# Patient Record
Sex: Male | Born: 1995 | Race: White | Hispanic: No | Marital: Single | Smoking: Never smoker
Health system: Southern US, Community
[De-identification: ages and names within clinical notes are randomized; demographics above are authoritative.]

## PROBLEM LIST (undated history)

## (undated) DIAGNOSIS — Q909 Down syndrome, unspecified: Secondary | ICD-10-CM

## (undated) DIAGNOSIS — E039 Hypothyroidism, unspecified: Secondary | ICD-10-CM

## (undated) DIAGNOSIS — F419 Anxiety disorder, unspecified: Secondary | ICD-10-CM

## (undated) DIAGNOSIS — K219 Gastro-esophageal reflux disease without esophagitis: Secondary | ICD-10-CM

## (undated) DIAGNOSIS — F840 Autistic disorder: Secondary | ICD-10-CM

## (undated) DIAGNOSIS — R569 Unspecified convulsions: Secondary | ICD-10-CM

## (undated) DIAGNOSIS — J302 Other seasonal allergic rhinitis: Secondary | ICD-10-CM

## (undated) HISTORY — DX: Unspecified convulsions: R56.9

## (undated) HISTORY — DX: Other seasonal allergic rhinitis: J30.2

## (undated) HISTORY — DX: Hypothyroidism, unspecified: E03.9

## (undated) HISTORY — DX: Anxiety disorder, unspecified: F41.9

## (undated) HISTORY — DX: Gastro-esophageal reflux disease without esophagitis: K21.9

---
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
## 2014-10-20 ENCOUNTER — Encounter (HOSPITAL_BASED_OUTPATIENT_CLINIC_OR_DEPARTMENT_OTHER): Payer: Self-pay | Admitting: *Deleted

## 2014-10-20 ENCOUNTER — Emergency Department (HOSPITAL_BASED_OUTPATIENT_CLINIC_OR_DEPARTMENT_OTHER)
Admission: EM | Admit: 2014-10-20 | Discharge: 2014-10-20 | Disposition: A | Discharge status: Home / Self Care | Payer: Medicaid Other | Attending: Emergency Medicine | Admitting: Emergency Medicine

## 2014-10-20 DIAGNOSIS — Z79899 Other long term (current) drug therapy: Secondary | ICD-10-CM | POA: Diagnosis not present

## 2014-10-20 DIAGNOSIS — Q909 Down syndrome, unspecified: Secondary | ICD-10-CM | POA: Diagnosis not present

## 2014-10-20 DIAGNOSIS — F840 Autistic disorder: Secondary | ICD-10-CM | POA: Diagnosis not present

## 2014-10-20 DIAGNOSIS — R195 Other fecal abnormalities: Secondary | ICD-10-CM | POA: Diagnosis present

## 2014-10-20 DIAGNOSIS — K625 Hemorrhage of anus and rectum: Secondary | ICD-10-CM | POA: Insufficient documentation

## 2014-10-20 HISTORY — DX: Down syndrome, unspecified: Q90.9

## 2014-10-20 HISTORY — DX: Autistic disorder: F84.0

## 2014-10-20 LAB — COMPREHENSIVE METABOLIC PANEL
ALT: 32 U/L (ref 0–53)
AST: 20 U/L (ref 0–37)
Albumin: 3.8 g/dL (ref 3.5–5.2)
Alkaline Phosphatase: 71 U/L (ref 39–117)
Anion gap: 8 (ref 5–15)
BUN: 10 mg/dL (ref 6–23)
CO2: 29 mEq/L (ref 19–32)
Calcium: 9.5 mg/dL (ref 8.4–10.5)
Chloride: 103 mEq/L (ref 96–112)
Creatinine, Ser: 0.9 mg/dL (ref 0.50–1.35)
GFR calc Af Amer: >90 mL/min (ref >90)
GFR calc non Af Amer: >90 mL/min (ref >90)
Glucose, Bld: 91 mg/dL (ref 70–99)
Potassium: 4.4 mEq/L (ref 3.7–5.3)
Sodium: 140 mEq/L (ref 137–147)
Total Bilirubin: 0.3 mg/dL (ref 0.3–1.2)
Total Protein: 7.3 g/dL (ref 6.0–8.3)

## 2014-10-20 LAB — CBC WITH DIFFERENTIAL/PLATELET
Basophils Absolute: 0.1 10*3/uL (ref 0.0–0.1)
Basophils Relative: 1 % (ref 0–1)
Eosinophils Absolute: 0.1 10*3/uL (ref 0.0–0.7)
Eosinophils Relative: 2 % (ref 0–5)
HCT: 45.6 % (ref 39.0–52.0)
Hemoglobin: 16 g/dL (ref 13.0–17.0)
Lymphocytes Relative: 33 % (ref 12–46)
Lymphs Abs: 1.5 10*3/uL (ref 0.7–4.0)
MCH: 33.2 pg (ref 26.0–34.0)
MCHC: 35.1 g/dL (ref 30.0–36.0)
MCV: 94.6 fL (ref 78.0–100.0)
Monocytes Absolute: 0.5 10*3/uL (ref 0.1–1.0)
Monocytes Relative: 12 % (ref 3–12)
Neutro Abs: 2.3 10*3/uL (ref 1.7–7.7)
Neutrophils Relative %: 52 % (ref 43–77)
Platelets: 278 10*3/uL (ref 150–400)
RBC: 4.82 MIL/uL (ref 4.22–5.81)
RDW: 13.5 % (ref 11.5–15.5)
WBC: 4.4 10*3/uL (ref 4.0–10.5)

## 2014-10-20 NOTE — ED Provider Notes (Signed)
CSN: [id number]     Arrival date & time 10/20/14  [id number] History   First MD Initiated Contact with Patient 10/20/14 [phone]     Chief Complaint  Patient presents with  . Blood In Stools     (Consider location/radiation/quality/duration/timing/severity/associated sxs/prior Treatment) HPI Comments: Patient is a 18 year old male with history of Down syndrome and autism. He was brought here for evaluation of bloody stools that have been occurring for the past 2 days. He returned from camp one week ago but has not been having any problems until 2 days ago. There is been no constipation and no diarrhea. There is been no fever. Patient has not reported any pain, however he has a high pain tolerance and an inability to express when he hurts.  The history is provided by the patient.    Past Medical History  Diagnosis Date  . Autism   . Down's syndrome    Past Surgical History  Procedure Laterality Date  . Eye surgery    . Middle ear surgery     No family history on file. History  Substance Use Topics  . Smoking status: Never Smoker   . Smokeless tobacco: Never Used  . Alcohol Use: No    Review of Systems  All other systems reviewed and are negative.     Allergies  Sulfa antibiotics and Milk-related compounds  Home Medications   Prior to Admission medications   Medication Sig Start Date End Date Taking? Authorizing Provider  sertraline (ZOLOFT) 100 MG tablet Take 150 mg by mouth daily.   Yes Historical Provider, MD   BP 118/71 mmHg  Pulse 70  Temp(Src) 97.8 F (36.6 C) (Oral)  Resp 20  Wt 120 lb (54.432 kg)  SpO2 98% Physical Exam  Constitutional: He is oriented to person, place, and time. He appears well-developed and well-nourished. No distress.  HENT:  Head: Normocephalic and atraumatic.  Neck: Normal range of motion. Neck supple.  Cardiovascular: Normal rate, regular rhythm and normal heart sounds.   No murmur heard. Pulmonary/Chest: Effort normal and breath sounds  normal. No respiratory distress. He has no wheezes.  Abdominal: Soft. Bowel sounds are normal. He exhibits no distension. There is no tenderness.  Musculoskeletal: Normal range of motion. He exhibits no edema.  Neurological: He is alert and oriented to person, place, and time.  Skin: Skin is warm and dry. He is not diaphoretic.  Nursing note and vitals reviewed.   ED Course  Procedures (including critical care time) Labs Review Labs Reviewed - No data to display  Imaging Review No results found.   EKG Interpretation None      MDM   Final diagnoses:  None    Patient brought for evaluation of bloody bowel movements. Dad brought a picture which reveals bright red blood in the water surrounding his stool. He appears nontoxic, vitals are stable, and his laboratory studies are unremarkable. My ability to perform a rectal examination was very limited due to the patient's history of Down syndrome and autism.  I feel as though sedation for a simple rectal exam in this situation is not in the patient's best interest. I did discuss the case with Dr. Rhea BeltonPyrtle from gastroenterology who agrees with this assessment. We have decided that should the patient require sedation, it would be best to perform a more complete exam under a more controlled environment. He will make arrangements for the patient to be followed up in the office in the upcoming days.  I suspect the patient's  bleeding is related to a fissure. In the meantime I will recommend stool softeners and follow-up with GI. If he worsens, develops high fevers, severe pain, or other problems he is to return to the ER.    Michael Lyonsouglas Zitlaly Malson, MD 10/20/14 [phone]

## 2014-10-20 NOTE — ED Notes (Signed)
MD at bedside. 

## 2014-10-20 NOTE — Discharge Instructions (Signed)
Follow-up with Dr. Rhea BeltonPyrtle in the gastroenterology office. His contact information has been provided on this discharge summary. The office should call you in the next 2 days to arrange this appointment.   Bloody Stools Bloody stools often mean that there is a problem in the digestive tract. Your caregiver may use the term "melena" to describe black, tarry, and bad smelling stools or "hematochezia" to describe red or maroon-colored stools. Blood seen in the stool can be caused by bleeding anywhere along the intestinal tract.  A black stool usually means that blood is coming from the upper part of the gastrointestinal tract (esophagus, stomach, or small bowel). Passing maroon-colored stools or bright red blood usually means that blood is coming from lower down in the large bowel or the rectum. However, sometimes massive bleeding in the stomach or small intestine can cause bright red bloody stools.  Consuming black licorice, lead, iron pills, medicines containing bismuth subsalicylate, or blueberries can also cause black stools. Your caregiver can test black stools to see if blood is present. It is important that the cause of the bleeding be found. Treatment can then be started, and the problem can be corrected. Rectal bleeding may not be serious, but you should not assume everything is okay until you know the cause.It is very important to follow up with your caregiver or a specialist in gastrointestinal problems. CAUSES  Blood in the stools can come from various underlying causes.Often, the cause is not found during your first visit. Testing is often needed to discover the cause of bleeding in the gastrointestinal tract. Causes range from simple to serious or even life-threatening.Possible causes include:  Hemorrhoids.These are veins that are full of blood (engorged) in the rectum. They cause pain, inflammation, and may bleed.  Anal fissures.These are areas of painful tearing which may bleed. They are  often caused by passing hard stool.  Diverticulosis.These are pouches that form on the colon over time, with age, and may bleed significantly.  Diverticulitis.This is inflammation in areas with diverticulosis. It can cause pain, fever, and bloody stools, although bleeding is rare.  Proctitis and colitis. These are inflamed areas of the rectum or colon. They may cause pain, fever, and bloody stools.  Polyps and cancer. Colon cancer is a leading cause of preventable cancer death.It often starts out as precancerous polyps that can be removed during a colonoscopy, preventing progression into cancer. Sometimes, polyps and cancer may cause rectal bleeding.  Gastritis and ulcers.Bleeding from the upper gastrointestinal tract (near the stomach) may travel through the intestines and produce black, sometimes tarry, often bad smelling stools. In certain cases, if the bleeding is fast enough, the stools may not be black, but red and the condition may be life-threatening. SYMPTOMS  You may have stools that are bright red and bloody, that are normal color with blood on them, or that are dark black and tarry. In some cases, you may only have blood in the toilet bowl. Any of these cases need medical care. You may also have:  Pain at the anus or anywhere in the rectum.  Lightheadedness or feeling faint.  Extreme weakness.  Nausea or vomiting.  Fever. DIAGNOSIS Your caregiver may use the following methods to find the cause of your bleeding:  Taking a medical history. Age is important. Older people tend to develop polyps and cancer more often. If there is anal pain and a hard, large stool associated with bleeding, a tear of the anus may be the cause. If blood drips into  the toilet after a bowel movement, bleeding hemorrhoids may be the problem. The color and frequency of the bleeding are additional considerations. In most cases, the medical history provides clues, but seldom the final answer.  A visual  and finger (digital) exam. Your caregiver will inspect the anal area, looking for tears and hemorrhoids. A finger exam can provide information when there is tenderness or a growth inside. In men, the prostate is also examined.  Endoscopy. Several types of small, long scopes (endoscopes) are used to view the colon.  In the office, your caregiver may use a rigid, or more commonly, a flexible viewing sigmoidoscope. This exam is called flexible sigmoidoscopy. It is performed in 5 to 10 minutes.  A more thorough exam is accomplished with a colonoscope. It allows your caregiver to view the entire 5 to 6 foot long colon. Medicine to help you relax (sedative) is usually given for this exam. Frequently, a bleeding lesion may be present beyond the reach of the sigmoidoscope. So, a colonoscopy may be the best exam to start with. Both exams are usually done on an outpatient basis. This means the patient does not stay overnight in the hospital or surgery center.  An upper endoscopy may be needed to examine your stomach. Sedation is used and a flexible endoscope is put in your mouth, down to your stomach.  A barium enema X-ray. This is an X-ray exam. It uses liquid barium inserted by enema into the rectum. This test alone may not identify an actual bleeding point. X-rays highlight abnormal shadows, such as those made by lumps (tumors), diverticuli, or colitis. TREATMENT  Treatment depends on the cause of your bleeding.   For bleeding from the stomach or colon, the caregiver doing your endoscopy or colonoscopy may be able to stop the bleeding as part of the procedure.  Inflammation or infection of the colon can be treated with medicines.  Many rectal problems can be treated with creams, suppositories, or warm baths.  Surgery is sometimes needed.  Blood transfusions are sometimes needed if you have lost a lot of blood.  For any bleeding problem, let your caregiver know if you take aspirin or other blood  thinners regularly. HOME CARE INSTRUCTIONS   Take any medicines exactly as prescribed.  Keep your stools soft by eating a diet high in fiber. Prunes (1 to 3 a day) work well for many people.  Drink enough water and fluids to keep your urine clear or pale yellow.  Take sitz baths if advised. A sitz bath is when you sit in a bathtub with warm water for 10 to 15 minutes to soak, soothe, and cleanse the rectal area.  If enemas or suppositories are advised, be sure you know how to use them. Tell your caregiver if you have problems with this.  Monitor your bowel movements to look for signs of improvement or worsening. SEEK MEDICAL CARE IF:   You do not improve in the time expected.  Your condition worsens after initial improvement.  You develop any new symptoms. SEEK IMMEDIATE MEDICAL CARE IF:   You develop severe or prolonged rectal bleeding.  You vomit blood.  You feel weak or faint.  You have a fever. MAKE SURE YOU:  Understand these instructions.  Will watch your condition.  Will get help right away if you are not doing well or get worse. Document Released: 11/19/2002 Document Revised: 02/21/2012 Document Reviewed: 04/16/2011 [facility] Patient Information 2015 [employer] [place]. This information is not intended to replace advice  given to you by your health care provider. Make sure you discuss any questions you have with your health care provider.

## 2014-10-20 NOTE — ED Notes (Signed)
Father reports pt has had blood in BM since Friday- pt recently went to camp and returned last Sunday- Pt has autism and downs syndrome

## 2014-10-21 ENCOUNTER — Telehealth: Payer: Self-pay

## 2014-10-21 NOTE — Telephone Encounter (Signed)
-----   Message from Beverley FiedlerJay M Pyrtle, MD sent at 10/20/2014 11:30 AM EST ----- Regarding: [facility]  N[place] from ED doc with [facility], seemed benign and limited, but request for GI opinion.  Please add on to APP schedule for evaluate of rectal bleeding. Thank you Pts downs syndrome, so communicate with father Thanks Vonna KotykJay

## 2014-10-21 NOTE — Telephone Encounter (Signed)
Left message to call back  

## 2014-10-22 NOTE — Telephone Encounter (Signed)
Left message for pt to call back  °

## 2014-10-23 NOTE — Telephone Encounter (Signed)
After multiple attempts have been unable to reach pts. Family.

## 2017-07-06 ENCOUNTER — Other Ambulatory Visit: Payer: Self-pay | Admitting: Gastroenterology

## 2017-07-06 DIAGNOSIS — R112 Nausea with vomiting, unspecified: Secondary | ICD-10-CM

## 2017-07-12 ENCOUNTER — Ambulatory Visit
Admission: RE | Admit: 2017-07-12 | Discharge: 2017-07-12 | Disposition: A | Discharge status: Home / Self Care | Payer: Medicaid Other | Source: Ambulatory Visit | Attending: Gastroenterology | Admitting: Gastroenterology

## 2017-07-12 ENCOUNTER — Other Ambulatory Visit: Payer: Self-pay | Admitting: Gastroenterology

## 2017-07-12 DIAGNOSIS — R112 Nausea with vomiting, unspecified: Secondary | ICD-10-CM

## 2018-12-08 ENCOUNTER — Emergency Department (HOSPITAL_COMMUNITY)
Admission: EM | Admit: 2018-12-08 | Discharge: 2018-12-09 | Disposition: A | Discharge status: Home / Self Care | Payer: Medicaid Other | Attending: Emergency Medicine | Admitting: Emergency Medicine

## 2018-12-08 ENCOUNTER — Encounter (HOSPITAL_COMMUNITY): Payer: Self-pay | Admitting: Emergency Medicine

## 2018-12-08 DIAGNOSIS — R569 Unspecified convulsions: Secondary | ICD-10-CM | POA: Insufficient documentation

## 2018-12-08 DIAGNOSIS — F840 Autistic disorder: Secondary | ICD-10-CM | POA: Insufficient documentation

## 2018-12-08 DIAGNOSIS — Z79899 Other long term (current) drug therapy: Secondary | ICD-10-CM | POA: Insufficient documentation

## 2018-12-08 DIAGNOSIS — Q909 Down syndrome, unspecified: Secondary | ICD-10-CM | POA: Insufficient documentation

## 2018-12-08 NOTE — ED Triage Notes (Signed)
Brought by ems from home for c/o new onset seizure.  Per family clonic tonic in nature and lasted 45-90 seconds.  Patient immediately back to baseline other than being tired and sluggish afterwards.  Hx of down syndrome.

## 2018-12-09 ENCOUNTER — Emergency Department (HOSPITAL_COMMUNITY): Payer: Medicaid Other

## 2018-12-09 LAB — BASIC METABOLIC PANEL
Anion gap: 7 (ref 5–15)
BUN: 8 mg/dL (ref 6–20)
CO2: 28 mmol/L (ref 22–32)
Calcium: 9.1 mg/dL (ref 8.9–10.3)
Chloride: 103 mmol/L (ref 98–111)
Creatinine, Ser: 0.97 mg/dL (ref 0.61–1.24)
GFR calc Af Amer: >60 mL/min (ref >60)
GLUCOSE: 82 mg/dL (ref 70–99)
Potassium: 3.7 mmol/L (ref 3.5–5.1)
Sodium: 138 mmol/L (ref 135–145)

## 2018-12-09 LAB — CBC
HCT: 46.3 % (ref 39.0–52.0)
Hemoglobin: 15.5 g/dL (ref 13.0–17.0)
MCH: 32 pg (ref 26.0–34.0)
MCHC: 33.5 g/dL (ref 30.0–36.0)
MCV: 95.5 fL (ref 80.0–100.0)
Platelets: 318 10*3/uL (ref 150–400)
RBC: 4.85 MIL/uL (ref 4.22–5.81)
RDW: 12.8 % (ref 11.5–15.5)
WBC: 4.9 10*3/uL (ref 4.0–10.5)
nRBC: 0 % (ref 0.0–0.2)

## 2018-12-09 NOTE — ED Notes (Addendum)
Pt is a down's syndrome pt and family reports a seizure that lasted 45 secs to a minute long in duration. Family reports the pt got rigid and then was sleepy afterwards. Family denies a history of seizures. Family reports pt is at his baseline now.

## 2018-12-09 NOTE — ED Provider Notes (Signed)
[facility] Provider Note   CSN: [id number] Arrival date & time: 12/08/18  2256     History   Chief Complaint Chief Complaint  Patient presents with  . Seizures    HPI Michael Kirby is a 22 y.o. male. Level 5 caveat due to mostly nonverbal status. HPI Patient presents after a seizure.  History of Down syndrome.  Today was at home and stiffened up.  Reportedly was making some weird sounds.  Had episode of being sweaty and pale after it.  More sluggish afterwards.  This is typical for him after he has reflux or other type of symptoms.  No previous seizures.  Patient has Down syndrome and autism and is mostly nonverbal.  He is back at his baseline now.  Did not sleep much over the last couple nights.  He is usually a poor sleeper but is been worse the last few days.  No recent changes in medications. Past Medical History:  Diagnosis Date  . Autism   . Down's syndrome     There are no active problems to display for this patient.   Past Surgical History:  Procedure Laterality Date  . EYE SURGERY    . MIDDLE EAR SURGERY          Home Medications    Prior to Admission medications   Medication Sig Start Date End Date Taking? Authorizing Provider  cetirizine (ZYRTEC) 10 MG tablet Take 10 mg by mouth daily as needed for allergies.   Yes [provider]  levothyroxine (SYNTHROID, LEVOTHROID) 75 MCG tablet Take 75 mcg by mouth daily before breakfast.   Yes [provider]  Melatonin 1 MG TABS Take 2 mg by mouth at bedtime.   Yes [provider]  omeprazole (PRILOSEC) 20 MG capsule Take 20 mg by mouth 2 (two) times daily before a meal.   Yes [provider]  sertraline (ZOLOFT) 100 MG tablet Take 100 mg by mouth daily.    Yes [provider]    Family History No family history on file.  Social History Social History   Tobacco Use  . Smoking status: Never Smoker  . Smokeless tobacco: Never  Used  Substance Use Topics  . Alcohol use: No  . Drug use: Not on file     Allergies   Milk-related compounds; Sulfa antibiotics; Wheat bran; and Soy allergy   Review of Systems Review of Systems  Unable to perform ROS: Patient nonverbal     Physical Exam Updated Vital Signs BP (!) 94/46 (BP Location: Right Arm)   Pulse 94   Temp 97.8 F (36.6 C) (Axillary)   Resp 18   Ht 5' (1.524 m)   Wt 59 kg   SpO2 97%   BMI 25.39 kg/m   Physical Exam HENT:     Head:     Comments: Downs facies.  Patient has bite marks on his tongue bilaterally. Eyes:     Pupils: Pupils are equal, round, and reactive to light.  Cardiovascular:     Rate and Rhythm: Normal rate and regular rhythm.  Pulmonary:     Effort: Pulmonary effort is normal.  Abdominal:     Tenderness: There is no abdominal tenderness.  Musculoskeletal:        General: No deformity.  Skin:    General: Skin is warm.     Capillary Refill: Capillary refill takes less than 2 seconds.  Neurological:     Mental Status: He is alert.  Comments: At baseline.  Mostly nonverbal.  Moving all extremities.      ED Treatments / Results  Labs (all labs ordered are listed, but only abnormal results are displayed) Labs Reviewed  BASIC METABOLIC PANEL  CBC    EKG EKG Interpretation  Date/Time:  Saturday December 09 2018 01:23:36 EST Ventricular Rate:  86 PR Interval:    QRS Duration: 92 QT Interval:  354 QTC Calculation: 424 R Axis:   99 Text Interpretation:  Sinus rhythm Consider right ventricular hypertrophy Borderline T abnormalities, inferior leads ST elev, probable normal early repol pattern No old tracing to compare Confirmed by Benjiman CorePickering, Zoey Bidwell [phone]([id number]) on 12/09/2018 1:32:21 AM   Radiology Ct Head Wo Contrast  Result Date: 12/09/2018 CLINICAL DATA:  New onset seizures. EXAM: CT HEAD WITHOUT CONTRAST TECHNIQUE: Contiguous axial images were obtained from the base of the skull through the vertex without  intravenous contrast. COMPARISON:  None. FINDINGS: Brain: No evidence of acute infarction, hemorrhage, hydrocephalus, extra-axial collection or mass lesion/mass effect. Vascular: No hyperdense vessel or unexpected calcification. Skull: Normal. Negative for fracture or focal lesion. Sinuses/Orbits: Deformity of the right globe is identified consistent with history of prior eye surgery. The left lobe is normal. The sinuses are clear. Other: None. IMPRESSION: No focal acute intracranial abnormality identified. Electronically Signed   By: Sherian ReinWei-Chen  Lin M.D.   On: 12/09/2018 01:59    Procedures Procedures (including critical care time)  Medications Ordered in ED Medications - No data to display   Initial Impression / Assessment and Plan / ED Course  I have reviewed the triage vital signs and the nursing notes.  Pertinent labs & imaging results that were available during my care of the patient were reviewed by me and considered in my medical decision making (see chart for details).    Patient with new onset fever.  White count not elevated no fever.  Head CT reassuring.  Back at his baseline.  No trauma.  EKG reassuring I think this is not arrhythmia as a cause for seizure-like activity.  Will have follow-up with outpatient neurology.  Patient already has 24-hour monitoring due to his autism.  [facility]  Final Clinical Impressions(s) / ED Diagnoses   Final diagnoses:  New onset seizure [facility](HCC)    ED Discharge Orders    None       Benjiman CorePickering, Brooklee Michelin, MD 12/09/18 [phone]

## 2018-12-20 ENCOUNTER — Ambulatory Visit: Payer: Medicaid Other | Admitting: Neurology

## 2018-12-20 ENCOUNTER — Encounter: Payer: Self-pay | Admitting: Neurology

## 2018-12-20 VITALS — BP 100/58 | HR 70 | Ht 60.0 in | Wt 136.0 lb

## 2018-12-20 DIAGNOSIS — R569 Unspecified convulsions: Secondary | ICD-10-CM | POA: Insufficient documentation

## 2018-12-20 NOTE — Progress Notes (Signed)
PATIENT: Michael Kirby DOB: 1996-01-11  Chief Complaint  Patient presents with  . New onset seizure    Note from ED on 12/08/18: Patient presents after a seizure. Today was at home and stiffened up.  Reportedly was making some weird sounds.  Had episode of being sweaty and pale after it.  More sluggish afterwards.  This is typical for him after he has reflux or other type of symptoms.  No previous seizures.  Patient has Down syndrome and autism and is mostly nonverbal.  He is back at his baseline now.  Did not sleep much over the last couple nights.  He is usually a poor sleeper.  No recent med changes.  Marland Kitchen PCP    [name], MD (referred from ED).  He is here with his adoptive mother, [name].     HISTORICAL  Michael Kirby is a 23 year old male, accompanied by his mother [name] and caregiver at today's visit,, seen in request by [name], for evaluation of seizure, initial evaluation was on December 20, 2018  I have reviewed and summarized the referring note from the referring physician.  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  On December 08, 2018, after few prolonged excited holidays, staying up with his sibling, not sleeping well, while watching TV with his family, he was noted body stiffened up, making noises, jerking movement, lasting for few minutes, followed by postevent confusion,  He was taken to the emergency room, I personally reviewed CT head without contrast there was no acute abnormality,  Laboratory evaluations showed normal negative CMP, CBC.  He denies a history of seizure,  REVIEW OF SYSTEMS: Full 14 system review of systems performed and notable only for as above All other  review of systems were negative.  ALLERGIES: Allergies  Allergen Reactions  . Milk-Related Compounds Anaphylaxis and Rash  . Sulfa Antibiotics Other (See Comments)    As a child, prior to adoption by current family  . Wheat Bran Diarrhea  . Soy Allergy Rash    HOME MEDICATIONS: Current Outpatient Medications  Medication Sig Dispense Refill  . cetirizine (ZYRTEC) 10 MG tablet Take 10 mg by mouth daily as needed for allergies.    Marland Kitchen levothyroxine (SYNTHROID, LEVOTHROID) 100 MCG tablet Take 100 mcg by mouth daily.    Marland Kitchen MELATONIN PO Take 2 mg by mouth at bedtime.    Marland Kitchen omeprazole (PRILOSEC) 40 MG capsule Take 40 mg by mouth daily.    . sertraline (ZOLOFT) 100 MG tablet Take 100 mg by mouth daily.      No current facility-administered medications for this visit.     PAST MEDICAL HISTORY: Past Medical History:  Diagnosis Date  . Anxiety   . Autism   . Down's syndrome   . GERD (gastroesophageal reflux disease)   . Hypothyroidism   . Seasonal allergies   . Seizure (HCC)     PAST SURGICAL HISTORY: Past Surgical History:  Procedure Laterality Date  . CATARACT EXTRACTION, BILATERAL Bilateral 1995  . EYE SURGERY Bilateral 1998  . MIDDLE EAR SURGERY Bilateral 1997    FAMILY HISTORY: No family history on file.  SOCIAL HISTORY: Social History   Socioeconomic History  . Marital status: Single  Spouse name: Not on file  . Number of children: 0  . Years of education: 49  . Highest education level: High school graduate  Occupational History  . Occupation: works at coffee shop    Comment: A [employer]  Social Needs  . Financial resource strain: Not on file  . Food insecurity:    Worry: Not on file    Inability: Not on file  . Transportation needs:    Medical: Not on file    Non-medical: Not on file  Tobacco Use  . Smoking status: Never Smoker  . Smokeless tobacco: Never Used  Substance and Sexual Activity  . Alcohol use: No  . Drug use: Never  .  Sexual activity: Not on file  Lifestyle  . Physical activity:    Days per week: Not on file    Minutes per session: Not on file  . Stress: Not on file  Relationships  . Social connections:    Talks on phone: Not on file    Gets together: Not on file    Attends religious service: Not on file    Active member of club or organization: Not on file    Attends meetings of clubs or organizations: Not on file    Relationship status: Not on file  . Intimate partner violence:    Fear of current or ex partner: Not on file    Emotionally abused: Not on file    Physically abused: Not on file    Forced sexual activity: Not on file  Other Topics Concern  . Not on file  Social History Narrative   Lives at home with his adoptive parents, [name] and [name].  He was adopted at four months old.   Right-handed.   No caffeine use.     PHYSICAL EXAM   Vitals:   12/20/18 1048  BP: (!) 100/58  Pulse: 70  Weight: 136 lb (61.7 kg)  Height: 5' (1.524 m)    Not recorded      Body mass index is 26.56 kg/m.  PHYSICAL EXAMNIATION:  Gen: NAD, conversant, well nourised, obese, well groomed                     Cardiovascular: Regular rate rhythm, no peripheral edema, warm, nontender. Eyes: Conjunctivae clear without exudates or hemorrhage Neck: Supple, no carotid bruits. Pulmonary: Clear to auscultation bilaterally   NEUROLOGICAL EXAM:  MENTAL STATUS: Speech/cognition: He is alert, following simple command, not communicate,   CRANIAL NERVES:Pupils are round equal and briskly reactive to light. CN III, IV, VI: extraocular movement are normal. No ptosis.  Spontaneous horizontal nystagmus CN V: Facial sensation is intact to pinprick in all 3 divisions bilaterally. Corneal responses are intact.  CN VII: Face is symmetric with normal eye closure and smile. CN VIII: Hearing is normal to rubbing fingers CN IX, X: Palate elevates symmetrically. Phonation is normal. CN XI: Head turning and  shoulder shrug are intact CN XII: Tongue is midline with normal movements and no atrophy.  MOTOR: Move 4 limbs without difficulty  REFLEXES: Reflexes are 2+ and symmetric at the biceps, triceps, knees, and ankles. Plantar responses are flexor.  SENSORY: Withdrawal to pain  COORDINATION: Rapid alternating movements and fine finger movements are intact. There is no dysmetria on finger-to-nose and heel-knee-shin.    GAIT/STANCE: Abnormal posturing, fairly steady   DIAGNOSTIC DATA (LABS, IMAGING, TESTING) - I reviewed patient records, labs, notes, testing and imaging myself where available.  ASSESSMENT AND PLAN  Michael Kirby is a 23 y.o. male   Down syndrome New onset seizure on December 08, 2018  After discussed with his mother, this is his first generalized seizure, will hold off antibiotic medication at this point,  EEG  Call clinic for any recurrent event,   Levert FeinsteinYijun Garland Hincapie, M.D. Ph.D.  [facility] [address], [address] [place], [place] [address] Ph: [phone]) [phone] Fax: [phone]  CC: Tally JoeSwayne, David, MD

## 2019-01-22 ENCOUNTER — Ambulatory Visit: Payer: Medicaid Other | Admitting: Neurology

## 2019-01-22 DIAGNOSIS — R569 Unspecified convulsions: Secondary | ICD-10-CM

## 2019-02-01 NOTE — Procedures (Signed)
   HISTORY: 23 years old male, with Down syndrome, congestive heart disease, presented with seizure on December 08, 2018.  TECHNIQUE:  This is a routine 16 channel EEG recording with one channel devoted to a limited EKG recording.  It was performed during wakefulness, drowsiness and asleep.  Hyperventilation and photic stimulation were not performed.  There are frequent muscle and movement artifact noted.  Upon maximum arousal, posterior dominant waking rhythm consistent of dysrhythmic theta range activity 6 Hz. Activities are symmetric over the bilateral posterior derivations and attenuated with eye opening.  During EEG recording, patient developed drowsiness and no deeper stage of sleep was achieved  During EEG recording, there was no epileptiform discharge noted.  EKG demonstrate sinus rhythm, with heart rate of 72 bpm  CONCLUSION: This is an abnormal EEG.  There is no electrodiagnostic evidence of epileptiform discharge, there is evidence of generalized slowing of background activity, consistent with bihemisphere malfunction.  Levert Feinstein, M.D. Ph.D.  [facility] [address] [place], [place] [address] Phone: [phone] Fax:      [phone]

## 2019-02-05 ENCOUNTER — Telehealth: Payer: Self-pay | Admitting: Neurology

## 2019-02-05 NOTE — Telephone Encounter (Signed)
Left patient's mother Lars Mage) a detailed message, with results, on voicemail (ok per DPR).  Provided our number to call back with any questions.

## 2019-02-05 NOTE — Telephone Encounter (Signed)
Please call patient, EEG showed mild background slowing, consistent with his diagnosis of Down syndrome, no evidence of epileptiform discharge,  This is an abnormal EEG. There is no electrodiagnostic evidence  of epileptiform discharge, there is evidence of generalized  slowing of background activity, consistent with bihemisphere  malfunction.

## 2019-04-24 IMAGING — CT CT HEAD W/O CM
4 series · 15 of 47 positions shown, 17 images · non-contrast
Comparison: None.

CLINICAL DATA: New onset seizures.

EXAM:
CT HEAD WITHOUT CONTRAST
TECHNIQUE: Contiguous axial images were obtained from the base of the skull
through the vertex without intravenous contrast.

[Series 3: head wo · axial · 0.39mm/px · z∈[-105,+15]mm · 7 of 33 slices shown, 9 images]
[im 5/33  brain]
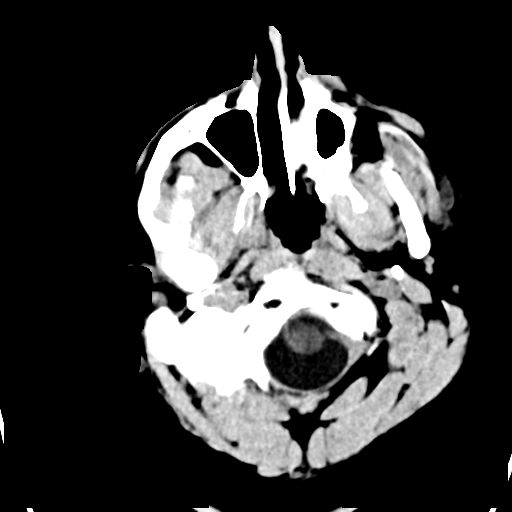
[im 5/33  bone]
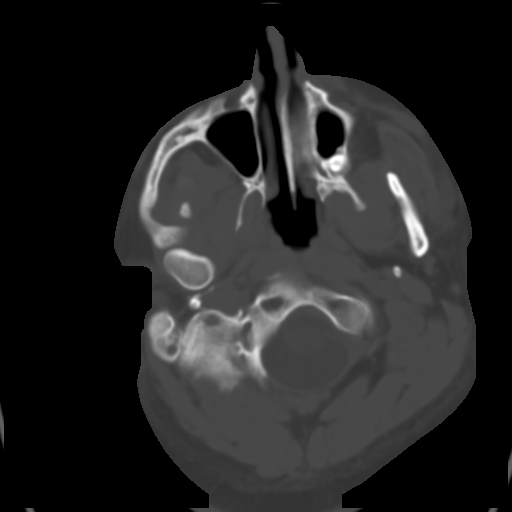
[im 9/33  brain]
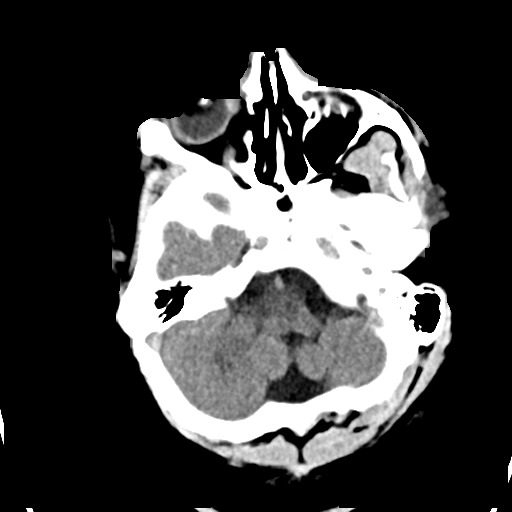
[im 13/33  brain]
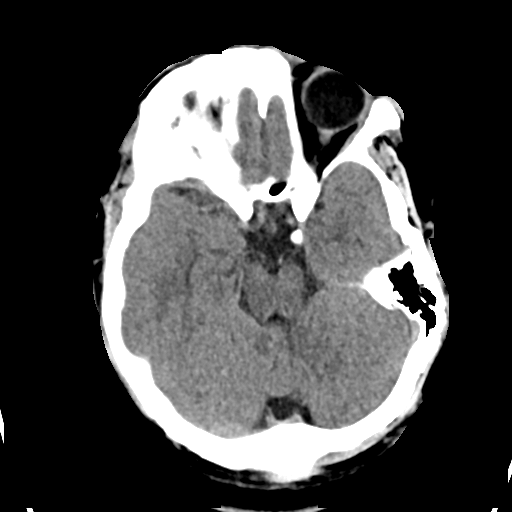
[im 17/33  brain]
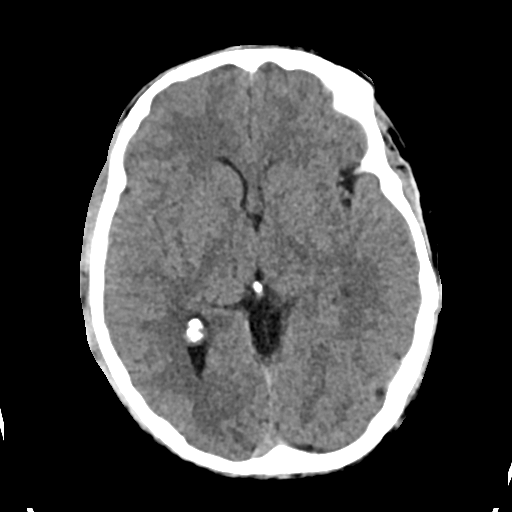
[im 21/33  brain]
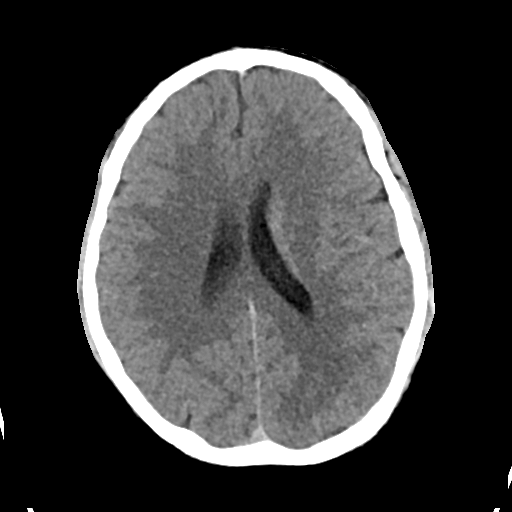
[im 21/33  bone]
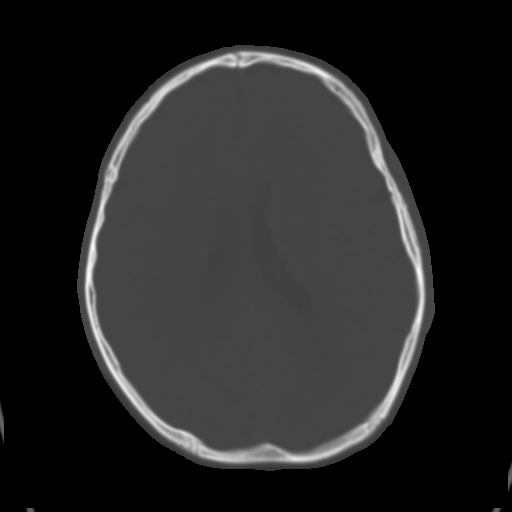
[im 25/33  brain]
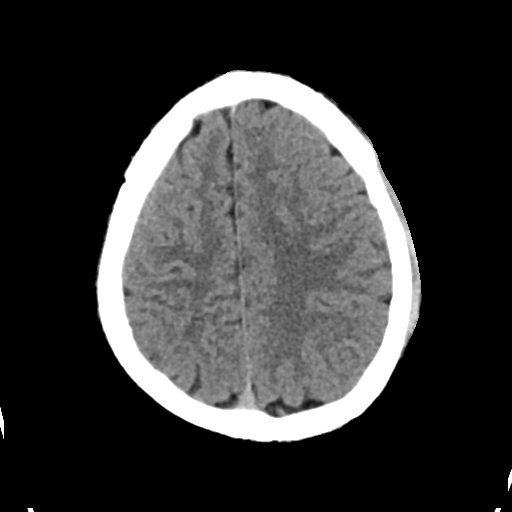
[im 29/33  brain]
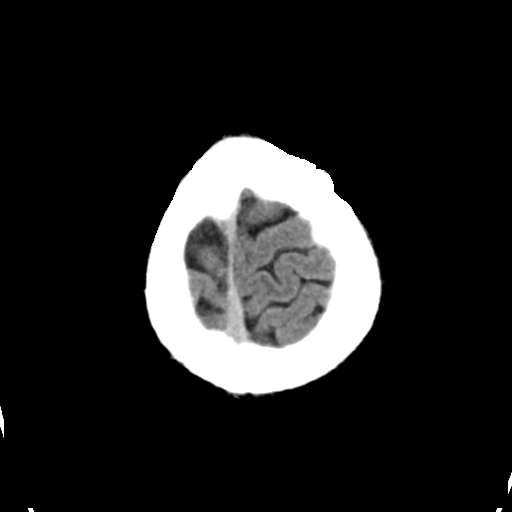

[Series 4: head bone · axial · 0.39mm/px · z∈[-109,-93]mm · 2 of 81 slices shown]
[im 9/81  bone]
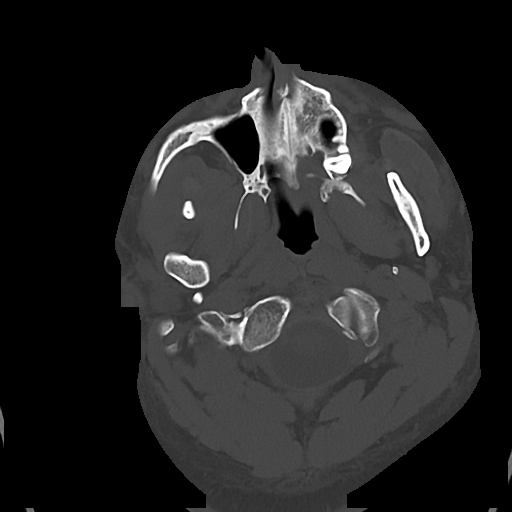
[im 17/81  bone]
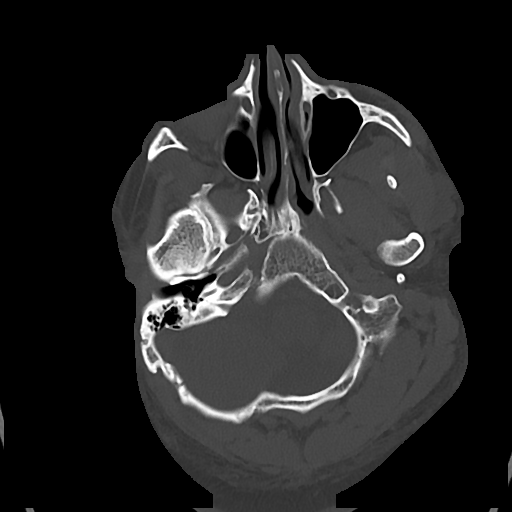

[Series 5: cor soft · coronal · 0.31mm/px · 3 of 73 slices shown]
[im 25/73  brain]
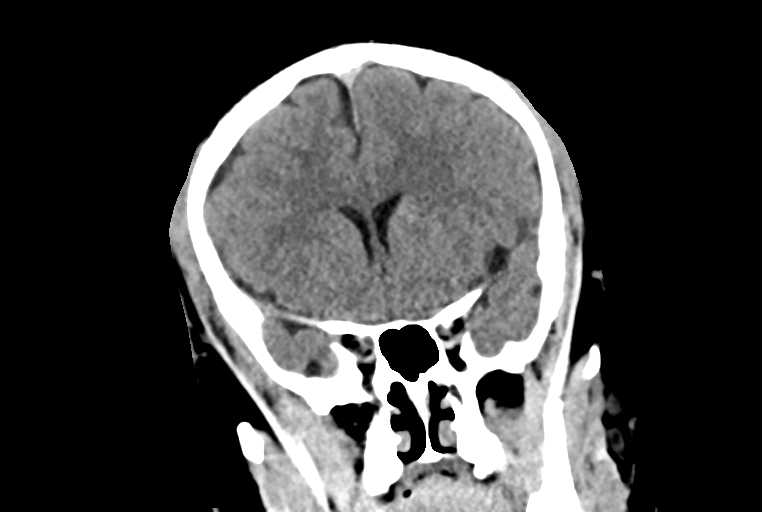
[im 33/73  brain]
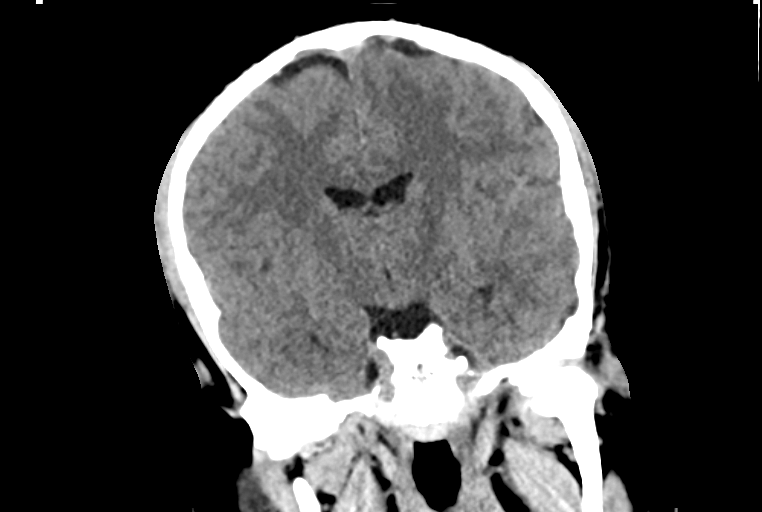
[im 41/73  brain]
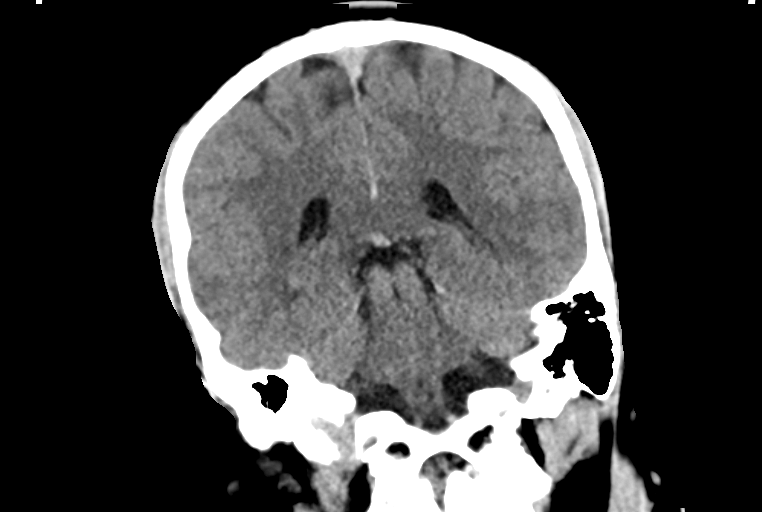

[Series 6: sag soft · sagittal · 0.38mm/px · 3 of 64 slices shown]
[im 22/64  brain]
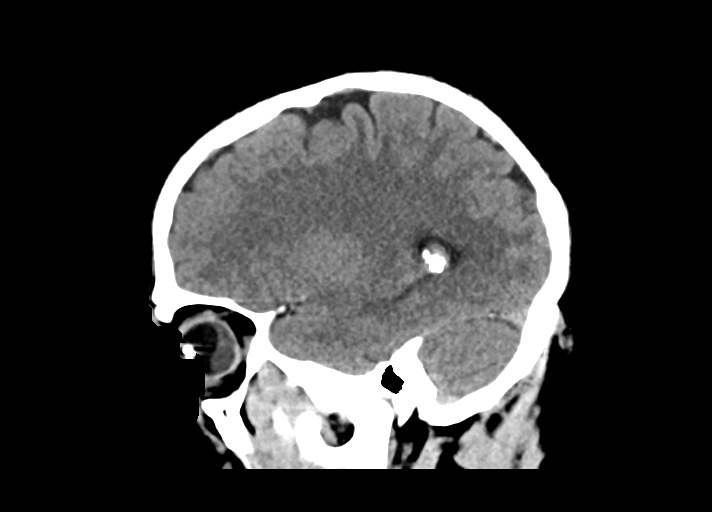
[im 32/64  brain]
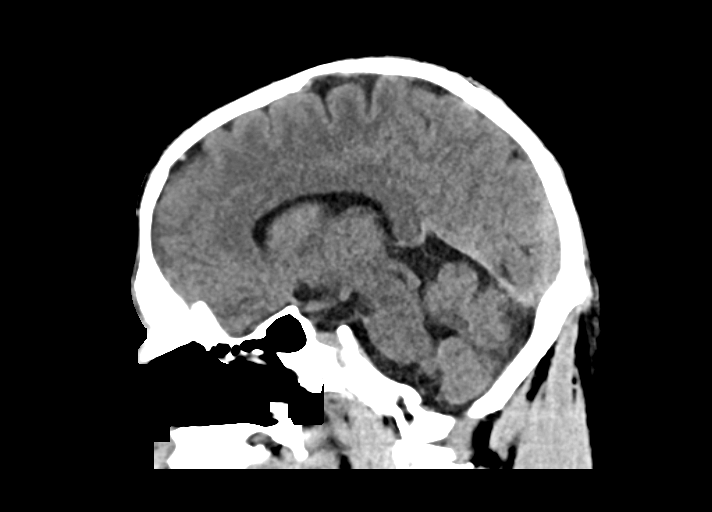
[im 43/64  brain]
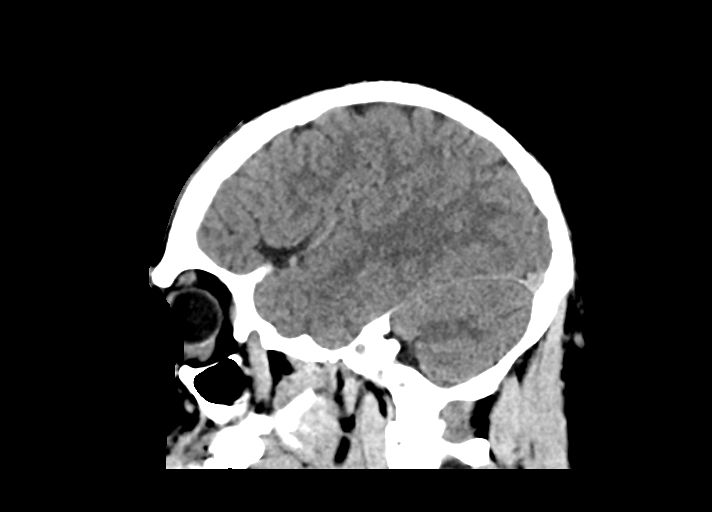

[15 of 47 positions shown; findings below may reference images not displayed]

FINDINGS: Brain: No evidence of acute infarction, hemorrhage, hydrocephalus,
extra-axial collection or mass lesion/mass effect.

Vascular: No hyperdense vessel or unexpected calcification.

Skull: Normal. Negative for fracture or focal lesion.

Sinuses/Orbits: Deformity of the right globe is identified
consistent with history of prior eye surgery. The left lobe is
normal. The sinuses are clear.

Other: None.
IMPRESSION: No focal acute intracranial abnormality identified.

## 2019-10-24 ENCOUNTER — Other Ambulatory Visit: Payer: Self-pay

## 2019-10-24 DIAGNOSIS — Z20822 Contact with and (suspected) exposure to covid-19: Secondary | ICD-10-CM

## 2019-10-26 LAB — NOVEL CORONAVIRUS, NAA: SARS-CoV-2, NAA: NOT DETECTED

## 2022-07-05 ENCOUNTER — Other Ambulatory Visit (HOSPITAL_BASED_OUTPATIENT_CLINIC_OR_DEPARTMENT_OTHER): Payer: Self-pay

## 2022-07-05 DIAGNOSIS — G4710 Hypersomnia, unspecified: Secondary | ICD-10-CM

## 2022-07-05 DIAGNOSIS — R0681 Apnea, not elsewhere classified: Secondary | ICD-10-CM

## 2022-07-05 DIAGNOSIS — R0683 Snoring: Secondary | ICD-10-CM

## 2022-08-05 ENCOUNTER — Ambulatory Visit (HOSPITAL_BASED_OUTPATIENT_CLINIC_OR_DEPARTMENT_OTHER): Payer: Medicare Other | Attending: Internal Medicine | Admitting: Internal Medicine

## 2022-08-05 DIAGNOSIS — G4733 Obstructive sleep apnea (adult) (pediatric): Secondary | ICD-10-CM | POA: Diagnosis not present

## 2022-08-05 DIAGNOSIS — R0683 Snoring: Secondary | ICD-10-CM | POA: Insufficient documentation

## 2022-08-05 DIAGNOSIS — G4710 Hypersomnia, unspecified: Secondary | ICD-10-CM | POA: Insufficient documentation

## 2022-08-05 DIAGNOSIS — R0681 Apnea, not elsewhere classified: Secondary | ICD-10-CM

## 2022-08-06 NOTE — Procedures (Signed)
NAME: Michael Kirby DATE OF BIRTH:  1996/11/15 MEDICAL RECORD NUMBER [id number]  LOCATION: [facility]  PHYSICIAN: Deretha Emory  DATE OF STUDY: 08/05/2022  SLEEP STUDY TYPE: SPLIT night study                REFERRING PHYSICIAN: Deretha Emory, MD  EPWORTH SLEEPINESS SCORE:  14 HEIGHT: 5' (152.4 cm)  WEIGHT: 139 lb (63 kg)    Body mass index is 27.15 kg/m.  NECK SIZE: 19 in.  CLINICAL INFORMATION The patient was referred to the sleep center for evaluation of obstructive sleep apnea seen on home sleep apnea testing.  Patient could not complete home sleep testing with adequate time to allow for definitive diagnosis.   MEDICATIONS Patient self administered medications include: MELATONIN, SERTRALINE, HYDROXYZINE PAMOATE, ZYRTEC. Medications administered during study include Sleep medicine administered - Melatonin 2.0 mg at 09:45:13 PM. SLEEP STUDY TECHNIQUE The patient underwent an attended overnight level one polysomnography titration to assess the effects of cpap therapy. The following variables were monitored: EEG (C4-A1, C3-A2, O1-A2, O2-A1), EOG, submental and leg EMG, ECG, oxyhemoglobin saturation by pulse oximetry, thoracic and abdominal respiratory effort belts, nasal/oral airflow by pressure sensor, body position sensor and snoring sensor. CPAP pressure was titrated to eliminate apneas, hypopneas and oxygen desaturation. The NPSG portion of the study ended at 1:52:19 AM . The BiPAP titration was initiated at 1:53:49 AM AM with the CPAP portion of the study ending at 4:59:16 AM. TECHNICAL COMMENTS Comments added by Technician: No restroom visted. Patient had difficulty initiating sleep. Comments added by Scorer: N/A SLEEP ARCHITECTURE The recording time for the entire night was 412.4 minutes. The diagnostic portion was initiated at 10:06:49 PM and terminated at 1:52:19 AM. The time in bed was 225.5 minutes. EEG confirmed total sleep time was 151.0 minutes  yielding a sleep efficiency of 67.0%. Sleep onset after lights out was 73.9 minutes.  The patient did not enter REM sleep. The patient spent 1.0% of the night in stage N1 sleep, 57.6% in stage N2 sleep, 41.4% in stage N3 and 0% in REM. The Arousal Index was 76.3/hour.  The titration portion was initiated at 1:53:49 AM and terminated at 4:59:16 AM. The time in bed was 185.5 minutes. EEG confirmed total sleep time was 185.5 minutes yielding a sleep efficiency of 100.0%. Sleep onset after CPAP initiation was 0.0 minutes with a REM latency of 85.4 minutes. The patient spent 0.0% of the night in stage N1 sleep, 67.9% in stage N2 sleep, 3.2% in stage N3 and 28.8% in REM. The Arousal Index was 25.2/hour. RESPIRATORY PARAMETERS During the diagnostic portion, there were a total of 167 respiratory disturbances recorded; 19 apneas ( 18 obstructive, 1 mixed, 0 central), 51 hypopneas and 97 RERAs. The apnea/hypopnea index 27.8 was events/hour and the RDI was 66.4 events/hour. The central sleep apnea index was 0.0 events/hour. The REM AHI was N/A /h and NREM AHI was 27.8/h. The REM RDI was 0.0 /h and NREM RDI was 66.4 /h. The supine AHI was 27.8/h, and the non supine AHI was 0/h; the patient was supine during 100.0% of sleep. The supine RDI was 66.4/h, and the non supine RDI was N/A/h. Respiratory disturbances were associated with oxygen desaturation down to a nadir of 86.0 % during sleep. The mean oxygen saturation during the study was 93.0%. The cumulative time under 88% oxygen saturation was 2.5 minutes.    During the titration portion, the apnea/hypopnea index (AHI) was 0.6 events/hour and the  RDI was 14.6 events/hour. The central sleep apnea index was events/hour. The most appropriate setting of CPAP was IPAP/EPAP 9/9 cm H2O. At this setting, the sleep efficiency was 100% and the patient was supine for 100%. The AHI was 0 events per hour(with 0 central events). Oxygen nadir was 94.0.  LEG MOVEMENT DATA The  periodic limb movement index was 0.0/hour during the diagnostic portion of the test. More limb movements were seen during the titration portion. However few were associated with arousal.  CARDIAC DATA The underlying cardiac rhythm was most consistent with sinus rhythm. Mean heart rate was 55.8 during diagnostic portion and 55.6 during titration portion of study. Additional rhythm abnormalities include None. IMPRESSIONS - Moderate Obstructive Sleep Apnea (OSA).  Optimal pressure obtained.  - Mild oxygen desaturations - Sleep efficiency on CPAP was 100%; probable REM rebound. - No Significant Central Sleep Apnea (CSA) DIAGNOSIS - Obstructive Sleep Apnea (G47.33) RECOMMENDATIONS - Trial of CPAP therapy on 9 cm H2O or auto adjusting CPAP with a X-Small size Resmed Full Face AirFit F10 for Her mask and heated humidification.  Deretha Emory Sleep specialist, American Board of Internal Medicine.   ELECTRONICALLY SIGNED ON:  08/06/2022, 8:05 AM [facility] PH: [phone]   FX: [phone] ACCREDITED BY THE AMERICAN ACADEMY OF SLEEP MEDICINE

## 2024-02-07 ENCOUNTER — Ambulatory Visit (INDEPENDENT_AMBULATORY_CARE_PROVIDER_SITE_OTHER): Payer: Medicare Other | Admitting: Neurology

## 2024-02-07 ENCOUNTER — Encounter: Payer: Self-pay | Admitting: Neurology

## 2024-02-07 VITALS — BP 108/73 | HR 87 | Ht 61.0 in | Wt 143.0 lb

## 2024-02-07 DIAGNOSIS — R569 Unspecified convulsions: Secondary | ICD-10-CM

## 2024-02-07 DIAGNOSIS — R404 Transient alteration of awareness: Secondary | ICD-10-CM | POA: Diagnosis not present

## 2024-02-07 NOTE — Progress Notes (Signed)
 Chief Complaint  Patient presents with   New Patient (Initial Visit)    Pt in 14, here with mother Michael Kirby  Pt is referred by Michael Klein PA for seizure like activity. Pt's mother states pt is having starring episodes, also states that pt is having trouble staying awake during the day.       ASSESSMENT AND PLAN  Michael Kirby is a 28 y.o. male   Staring spells, increased confusion History of seizure Down syndrome  Differentiation diagnosis includes subclinical seizure, early onset cognitive impairment in the background of Down syndrome,  EEG  Mother will keep a close eye on him, if he has recurrent spells, worsening symptoms will contact office    DIAGNOSTIC DATA (LABS, IMAGING, TESTING) - I reviewed patient records, labs, notes, testing and imaging myself where available.   MEDICAL HISTORY:  Michael Kirby, is a 28 year old male, accompanied by his mother seen in request by his primary care doctor Michael Kirby, for evaluation of increased confusion, staring spells  History is obtained from the patient and review of electronic medical records. I personally reviewed pertinent available imaging films in PACS.   I saw him in 2020, after he had a seizure on December 08, 2018, it happened in the setting of irregular sleep pattern, staying up late, he had a witnessed generalized tonic-clonic seizure, was evaluated at emergency room, CT head showed no acute abnormality,  EEG following that event showed generalized slowing, no epileptiform discharge  He had no recurrent seizure since  End of 2024 his family went through a lot of changes, remodeling, moving to a different house, he was noted to have increased confusion over the past few months, have to be called few times to respond, do not know where to put his butter, when he was able to do it well in the past, now they settled at their new home, he seems to do better  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  PHYSICAL EXAMNIATION:  Gen: NAD, conversant, well nourised, well groomed                     Cardiovascular: Regular rate rhythm, no peripheral edema, warm, nontender. Eyes: Conjunctivae clear without exudates or hemorrhage Neck: Supple, no carotid bruits. Pulmonary: Clear to auscultation bilaterally   NEUROLOGICAL EXAM:  MENTAL STATUS: Speech/cognition: Typical Down's syndrome dysmorphic feature, narrow oropharyngeal space, no his name, date of birth, follow command, CRANIAL NERVES: CN II:  Pupils are round equal and briskly reactive to light. CN III, IV, VI: extraocular movement are normal. No ptosis. CN V: Facial sensation is intact to light touch CN VII: Face is symmetric with normal eye closure  CN VIII: Hearing is normal to causal conversation. CN IX, X: Phonation is normal. CN XI: Head turning and shoulder shrug are intact  MOTOR: Moving 4 extremity without difficulty  REFLEXES: Reflexes are 2+ and symmetric at the biceps, triceps, knees, and ankles. Plantar responses are flexor.  SENSORY: Intact to light touch,   COORDINATION: There is no trunk or limb dysmetria noted.  GAIT/STANCE: Push-up, mildly unsteady  REVIEW OF SYSTEMS:  Full 14 system review of systems performed and notable  only for as above All other review of systems were negative.   ALLERGIES: Allergies  Allergen Reactions   Milk-Related Compounds Anaphylaxis and Rash   Sulfa Antibiotics Other (See Comments)    As a child, prior to adoption by current family   Wheat Diarrhea   Soy Allergy (Obsolete) Rash    HOME MEDICATIONS: Current Outpatient Medications  Medication Sig Dispense Refill   cetirizine (ZYRTEC) 10 MG tablet Take 10 mg by mouth daily as needed for allergies.      levothyroxine (SYNTHROID) 150 MCG tablet Take 150 mcg by mouth daily before breakfast.     MELATONIN PO Take 2 mg by mouth at bedtime.     modafinil (PROVIGIL) 100 MG tablet Take by mouth.     omeprazole (PRILOSEC) 40 MG capsule Take 40 mg by mouth daily.     sertraline (ZOLOFT) 100 MG tablet Take 100 mg by mouth daily.      No current facility-administered medications for this visit.    PAST MEDICAL HISTORY: Past Medical History:  Diagnosis Date   Anxiety    Autism    Down's syndrome    GERD (gastroesophageal reflux disease)    Hypothyroidism    Seasonal allergies    Seizure (HCC)     PAST SURGICAL HISTORY: Past Surgical History:  Procedure Laterality Date   CATARACT EXTRACTION, BILATERAL Bilateral 1995   EYE SURGERY Bilateral 1998   MIDDLE EAR SURGERY Bilateral 1997    FAMILY HISTORY: Family History  Adopted: Yes    SOCIAL HISTORY: Social History   Socioeconomic History   Marital status: Single    Spouse name: Not on file   Number of children: 0   Years of education: 12   Highest education level: High school graduate  Occupational History   Occupation: works at coffee shop    Comment: A [employer]  Tobacco Use   Smoking status: Never   Smokeless tobacco: Never  Substance and Sexual Activity   Alcohol use: No   Drug use: Never   Sexual activity: Not on file  Other Topics Concern   Not on file  Social History Narrative   Lives at home with his adoptive parents, Michael Kirby and Michael Kirby.  He was adopted at four months old.   Right-handed.   No caffeine use.   Social Drivers of Corporate investment banker Strain: Not on file  Food Insecurity: Not on file  Transportation Needs: Not on file  Physical Activity: Not on file  Stress: Not on file  Social Connections: Not on file  Intimate Partner Violence: Not on file      Michael Kirby, M.D. Ph.D.  [facility] [address] [place], [place] [address] Ph: [phone] Fax: [phone]  CC:  Michael Lund, PA [address] [place],  [place] [address]  Michael Joe, MD

## 2024-02-21 ENCOUNTER — Ambulatory Visit (INDEPENDENT_AMBULATORY_CARE_PROVIDER_SITE_OTHER): Payer: Medicare Other | Admitting: Neurology

## 2024-02-21 DIAGNOSIS — R569 Unspecified convulsions: Secondary | ICD-10-CM | POA: Diagnosis not present

## 2024-02-21 DIAGNOSIS — R404 Transient alteration of awareness: Secondary | ICD-10-CM

## 2024-03-01 NOTE — Procedures (Signed)
   HISTORY: 28 years old male with history of Down syndrome, increased confusion and staring spells  TECHNIQUE:  This is a routine 16 channel EEG recording with one channel devoted to a limited EKG recording.  It was performed during wakefulness, drowsiness and asleep.  Hyperventilation and photic stimulation were performed as activating procedures.  Tracing was obscured by frequent muscle artifact.  It is a technically difficult study due to frequent movement muscle artifact, upon maximum arousal, posterior dominant waking rhythm consistent of dysrhythmic low amplitude theta mixed with delta range activity. Activities are symmetric over the bilateral posterior derivations and attenuated with eye opening.  Photic stimulation did not alter the tracing.  During EEG recording, no sleep was achieved During EEG recording, there was no epileptiform discharge noted.  EKG demonstrate normal sinus rhythm.  CONCLUSION: It is a technically difficult study due to frequent muscle and movement artifact, there was mild to moderate background slowing, indicating moderate bihemispheric malfunction.  There was no epileptiform discharge.  Levert Feinstein, M.D. Ph.D.  [facility] [address] [place], [place] [address] Phone: [phone] Fax:      [phone]

## 2024-03-05 ENCOUNTER — Telehealth: Payer: Self-pay | Admitting: Neurology

## 2024-03-05 NOTE — Telephone Encounter (Signed)
 I called his mother EEG report, moderate slowing indicating bihemispheric malfunction, no evidence of epileptiform discharge,   CONCLUSION: It is a technically difficult study due to frequent muscle and movement artifact, there was mild to moderate background slowing, indicating moderate bihemispheric malfunction.  There was no epileptiform discharge.

## 2024-06-06 ENCOUNTER — Other Ambulatory Visit (HOSPITAL_COMMUNITY): Payer: Self-pay | Admitting: Internal Medicine

## 2024-06-06 DIAGNOSIS — R059 Cough, unspecified: Secondary | ICD-10-CM

## 2024-06-06 DIAGNOSIS — R1310 Dysphagia, unspecified: Secondary | ICD-10-CM

## 2024-06-22 ENCOUNTER — Ambulatory Visit (HOSPITAL_COMMUNITY)
Admission: RE | Admit: 2024-06-22 | Discharge: 2024-06-22 | Disposition: A | Discharge status: Home / Self Care | Source: Ambulatory Visit | Attending: *Deleted | Admitting: *Deleted

## 2024-06-22 ENCOUNTER — Ambulatory Visit (HOSPITAL_COMMUNITY)
Admission: RE | Admit: 2024-06-22 | Discharge: 2024-06-22 | Disposition: A | Discharge status: Home / Self Care | Source: Ambulatory Visit | Attending: Internal Medicine | Admitting: Internal Medicine

## 2024-06-22 DIAGNOSIS — K219 Gastro-esophageal reflux disease without esophagitis: Secondary | ICD-10-CM | POA: Insufficient documentation

## 2024-06-22 DIAGNOSIS — R1310 Dysphagia, unspecified: Secondary | ICD-10-CM | POA: Insufficient documentation

## 2024-06-22 DIAGNOSIS — R059 Cough, unspecified: Secondary | ICD-10-CM

## 2024-06-22 DIAGNOSIS — Q909 Down syndrome, unspecified: Secondary | ICD-10-CM | POA: Diagnosis not present

## 2024-06-22 DIAGNOSIS — F840 Autistic disorder: Secondary | ICD-10-CM | POA: Insufficient documentation

## 2024-06-22 NOTE — Evaluation (Signed)
 Modified Barium Swallow Study  Patient Details  Name: Michael Kirby MRN: [id number] Date of Birth: 1996-10-31  Today's Date: 06/22/2024  Modified Barium Swallow completed.  Full report located under Chart Review in the Imaging Section.  History of Present Illness Michael Kirby is a 28 y.o. male with hx of Down's Syndrome, autism, GERD who was referred for OP MBS due to frequent coughing and regurgitation, sometimes during meals, sometimes upwards of an hour after PO intake.  His mother was present to share history. He is being followed by GI.  He eats regular foods, drinks thin liquids at home.   Clinical Impression Pt presents with functional oropharyngeal swallow. He fed himself solids and thin and nectar thick liquids, the liquids with some impulsivity but with no aspiration. There was high penetration of thins that cleared the larynx upon completion of the swallow - PAS score of 2, considered to be [facility]. There was adequate oral manipulation/mastication of solids and normal pharyngeal contraction with no residue post-swallow.  Recommend further f/u with GI with regard to episodes of emesis/regurgitation. Study was reviewed/discussed with pt's mother upon completion. She agrees with results and recommendations. No SLP f/u is needed. Continue current diet.   Factors that may increase risk of adverse event in presence of aspiration Noe & Lianne 2021):  n/a  Swallow Evaluation Recommendations Recommendations: PO diet PO Diet Recommendation: Regular;Thin liquids (Level 0) Liquid Administration via: Cup;Straw Medication Administration: Whole meds with puree (per pt's preferences according to his mother) Supervision: Patient able to self-feed Oral care recommendations: Oral care BID (2x/day) Recommended consults: Other(comment) (continue GI f/u)    Sante Biedermann L. Vona, MA CCC/SLP Clinical Specialist - Acute Care SLP Acute Rehabilitation Services Office number [phone]   Vona Palma Laurice 06/22/2024,12:06 PM
